# Patient Record
Sex: Male | Born: 1995 | Race: White | Hispanic: No | Marital: Married | Smoking: Never smoker
Health system: Southern US, Community
[De-identification: ages and names within clinical notes are randomized; demographics above are authoritative.]

## PROBLEM LIST (undated history)

## (undated) HISTORY — PX: EYE SURGERY: SHX253

---

## 2019-09-12 ENCOUNTER — Ambulatory Visit (INDEPENDENT_AMBULATORY_CARE_PROVIDER_SITE_OTHER)

## 2019-09-12 ENCOUNTER — Encounter: Payer: Self-pay | Admitting: Emergency Medicine

## 2019-09-12 ENCOUNTER — Ambulatory Visit
Admission: EM | Admit: 2019-09-12 | Discharge: 2019-09-12 | Disposition: A | Discharge status: Home / Self Care | Attending: Family Medicine | Admitting: Family Medicine

## 2019-09-12 ENCOUNTER — Other Ambulatory Visit: Payer: Self-pay

## 2019-09-12 ENCOUNTER — Ambulatory Visit: Attending: Family Medicine

## 2019-09-12 DIAGNOSIS — S32009A Unspecified fracture of unspecified lumbar vertebra, initial encounter for closed fracture: Secondary | ICD-10-CM

## 2019-09-12 DIAGNOSIS — W19XXXA Unspecified fall, initial encounter: Secondary | ICD-10-CM

## 2019-09-12 DIAGNOSIS — R52 Pain, unspecified: Secondary | ICD-10-CM

## 2019-09-12 DIAGNOSIS — W109XXA Fall (on) (from) unspecified stairs and steps, initial encounter: Secondary | ICD-10-CM

## 2019-09-12 MED ORDER — HYDROCODONE-ACETAMINOPHEN 5-325 MG PO TABS: ORAL_TABLET | ORAL | 0 refills | Status: AC

## 2019-09-12 MED ORDER — CYCLOBENZAPRINE HCL 10 MG PO TABS: 10.0000 mg | ORAL_TABLET | Freq: Every day | ORAL | 0 refills | Status: AC
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
## 2019-09-12 NOTE — Discharge Instructions (Addendum)
Further recommendations pending CT scan results Follow up with Orthopedist spine/back specialist
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
## 2019-09-12 NOTE — ED Notes (Addendum)
Tried to do Prior auth for CT scan. Pt did not have insurance card and was scanned into the chart. Instructed pt to bring insurance card back to the office.

## 2021-05-06 IMAGING — CR DG LUMBAR SPINE COMPLETE 4+V
5 series · 5 of 5 positions shown · non-contrast
Comparison: None.

CLINICAL DATA: Fall, pain

EXAM:
LUMBAR SPINE - COMPLETE 4+ VIEW

[l-spine ap]
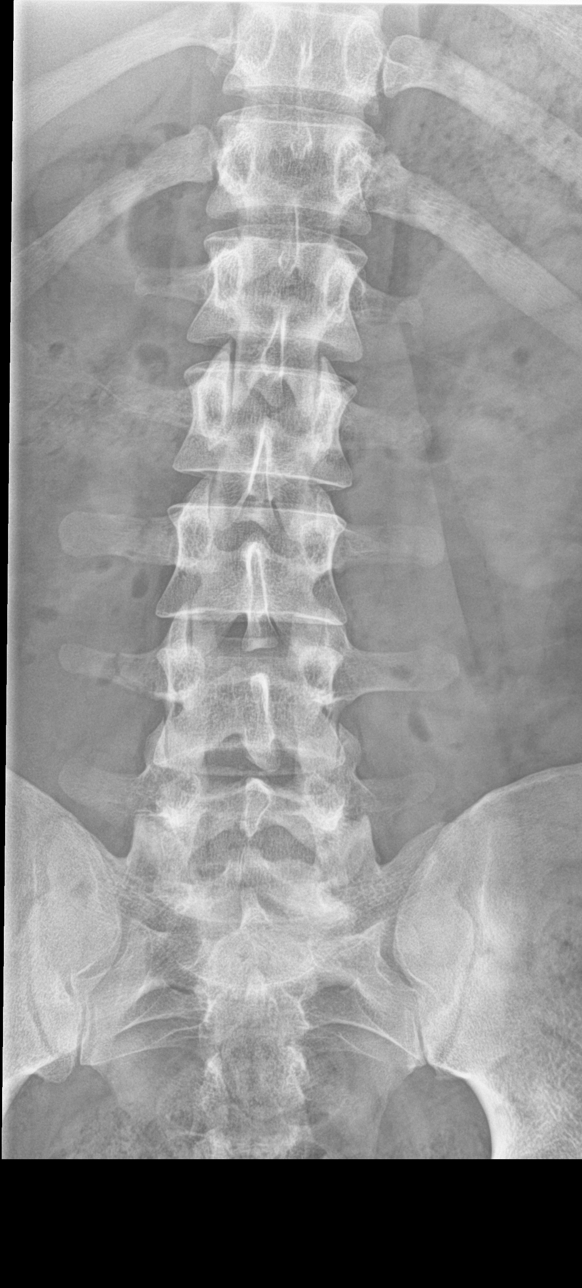

[l-spine obl (1 of 2)]
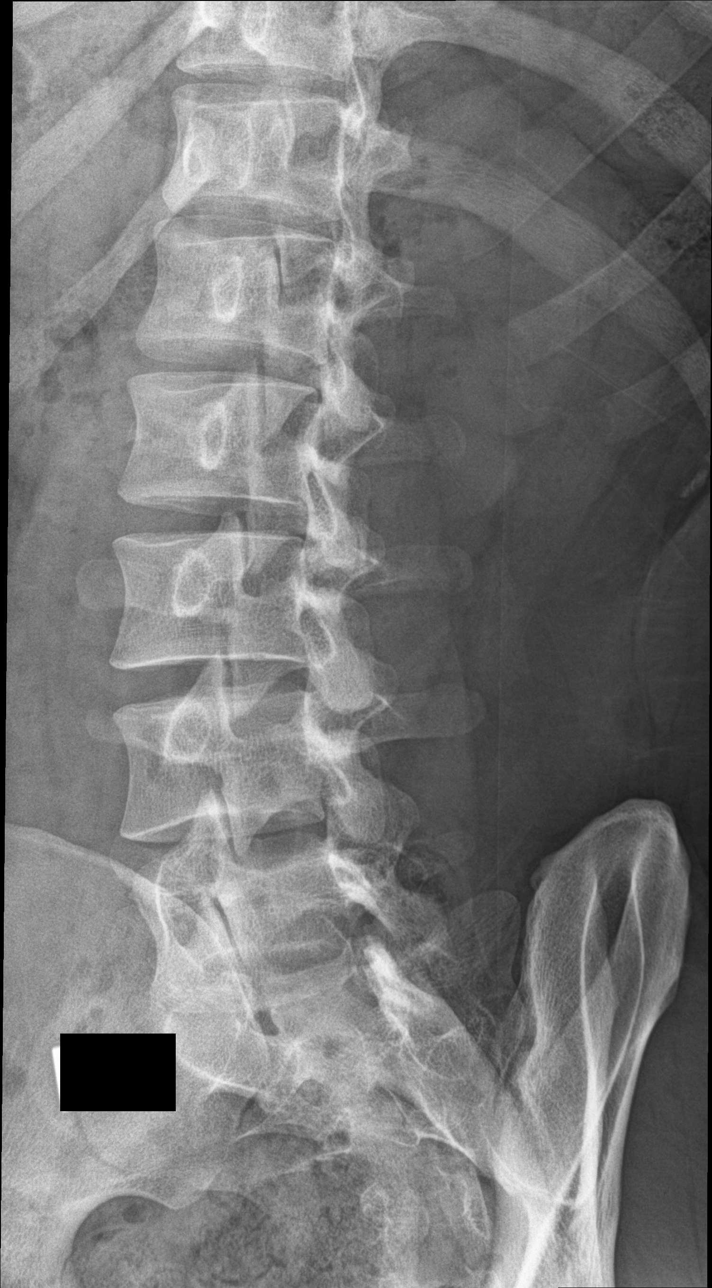

[l-spine obl (2 of 2)]
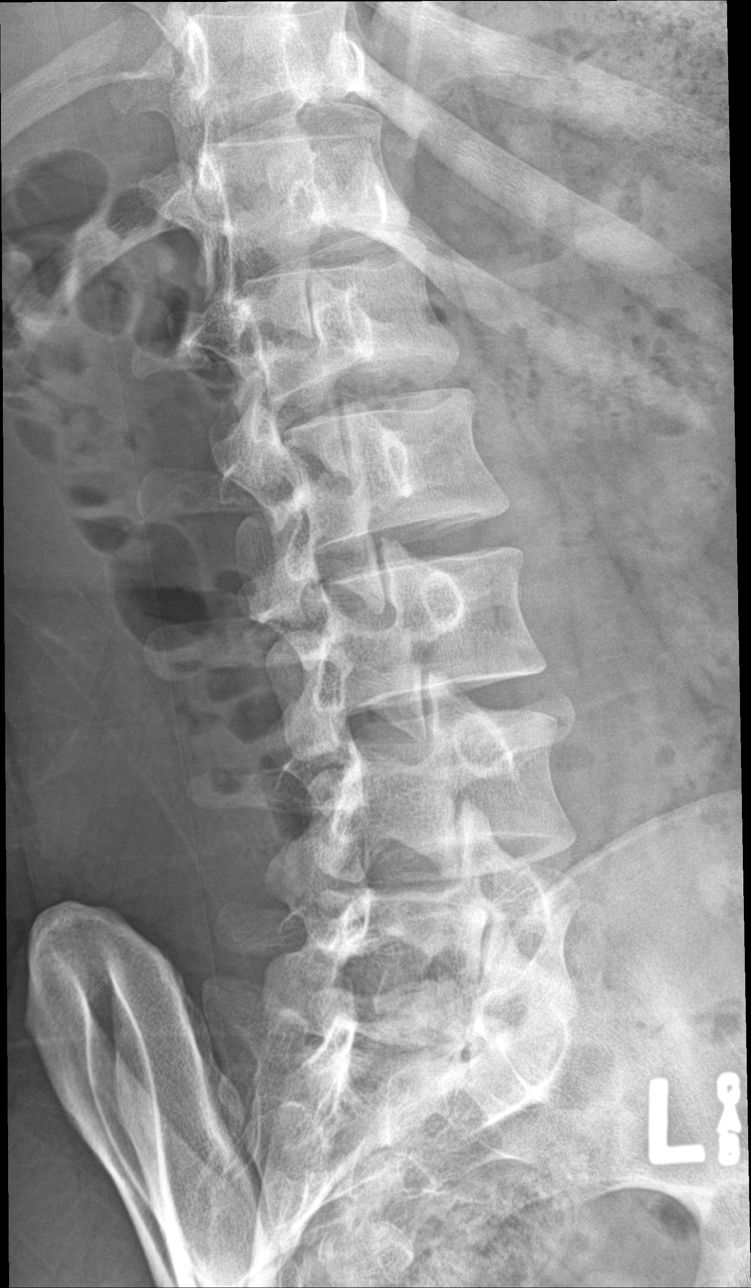

[l-spine lat]
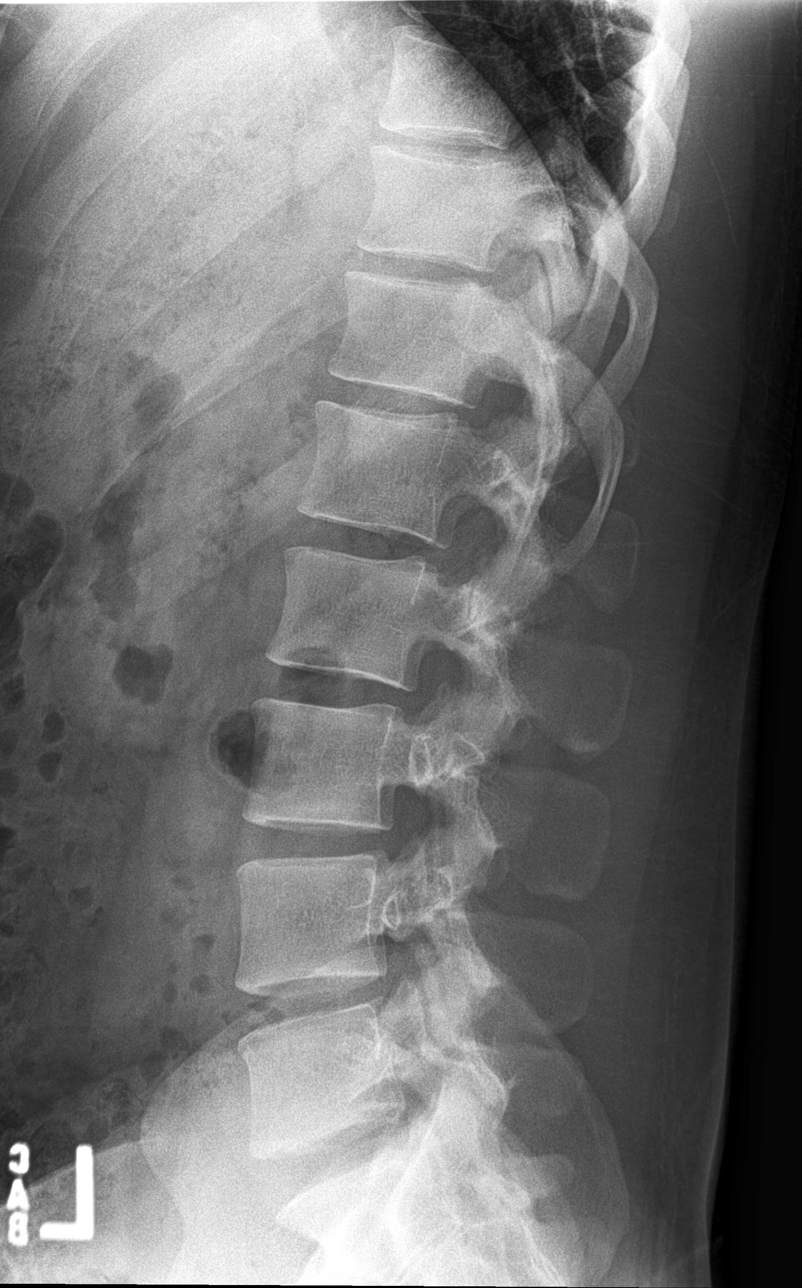

[l-spine spot]
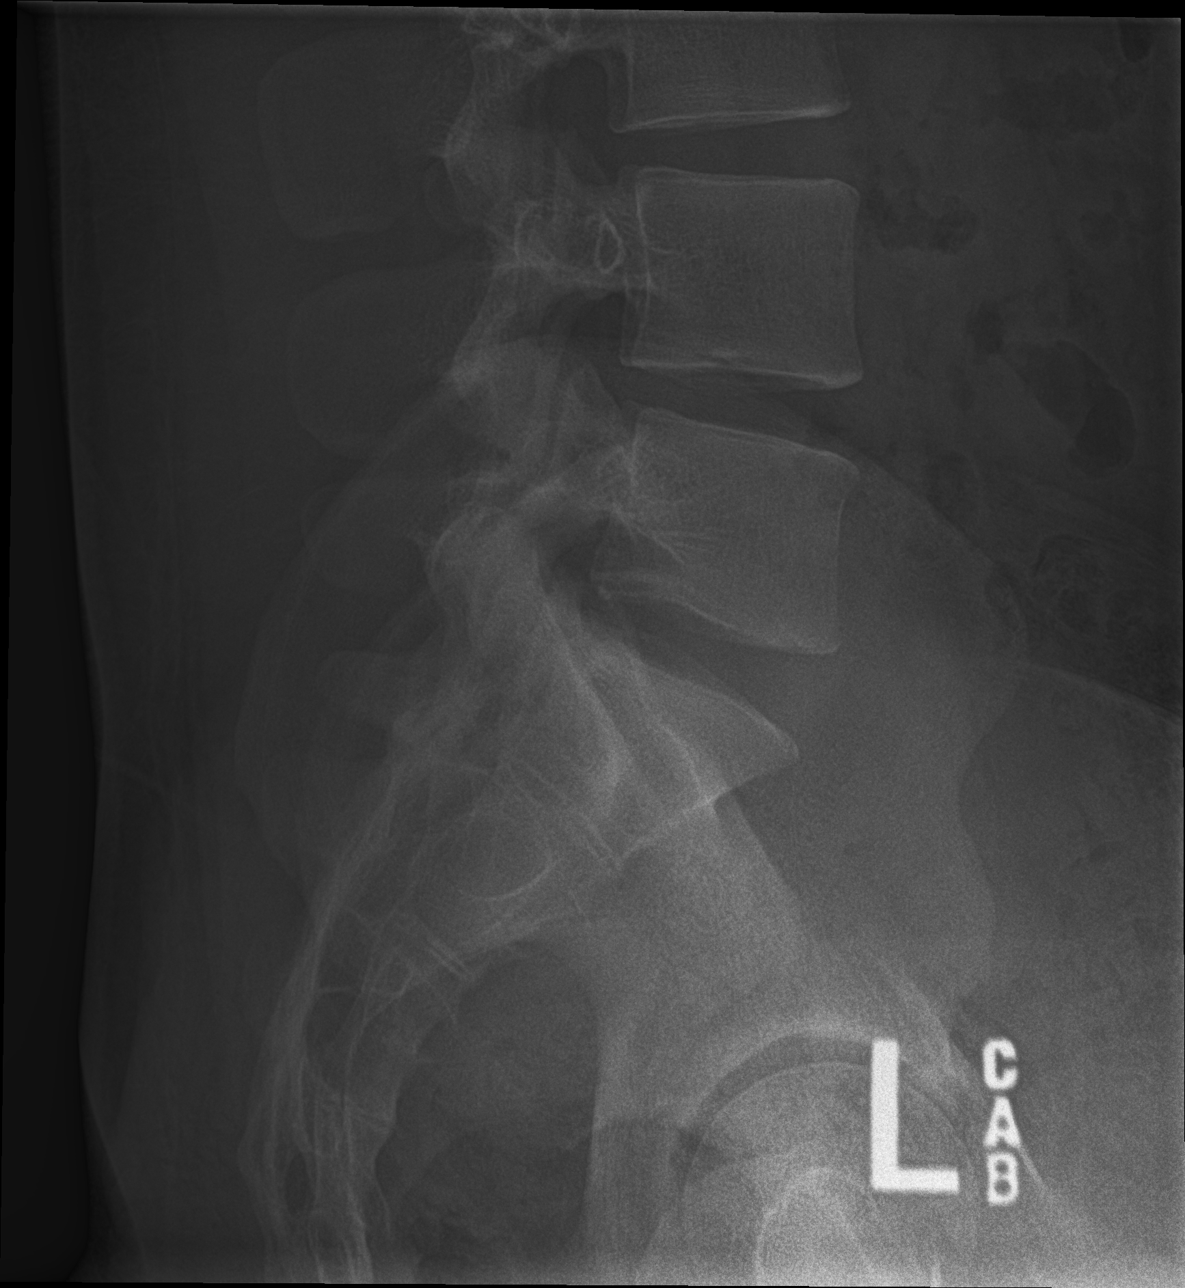

[5 of 5 positions shown; findings below may reference images not displayed]

FINDINGS: There are minimally displaced fractures of the right L3 and L4
transverse processes. No other fracture or dislocation of the lumbar
spine. Disc spaces and vertebral body heights are preserved.
Nonobstructive pattern of overlying bowel gas.
IMPRESSION: 1. There are minimally displaced fractures of the right L3 and L4
transverse processes.

2. No other fracture or dislocation of the lumbar spine. Disc spaces
and vertebral body heights are preserved.

## 2023-11-05 ENCOUNTER — Encounter: Payer: Self-pay | Admitting: Emergency Medicine

## 2023-11-05 ENCOUNTER — Ambulatory Visit
Admission: EM | Admit: 2023-11-05 | Discharge: 2023-11-05 | Disposition: A | Discharge status: Home / Self Care | Payer: Medicaid Other

## 2023-11-05 DIAGNOSIS — S86111A Strain of other muscle(s) and tendon(s) of posterior muscle group at lower leg level, right leg, initial encounter: Secondary | ICD-10-CM | POA: Diagnosis not present

## 2023-11-05 NOTE — Discharge Instructions (Addendum)
Rest your right calf is much as possible and avoid playing basketball for the time being.  You may do your normal activities of daily living.  Take over-the-counter ibuprofen or Aleve according to the package instructions to help with pain and inflammation.  Take these with food.  You may apply ice to your calf for 20 minutes at a time, 2-3 times a day, to help decrease inflammation and aid in pain relief.  Make sure there is a cloth between the ice and your skin so as to not cause skin damage.  Purchase a compression sleeve, such as a copper fit sleeve, from [employer] or the pharmacy to provide compression and add support.  Follow the rehabilitation exercises given your discharge instructions.  If your symptoms not better in 7 to 10 days, or they worsen, I recommend that you follow-up with orthopedics such as [facility] here in [place] or [place].

## 2023-11-05 NOTE — ED Provider Notes (Signed)
[facility]    CSN: [id number] Arrival date & time: 11/05/23  1428      History   Chief Complaint Chief Complaint  Patient presents with   Knee Injury    HPI Tiofilo Mancini is a 27 y.o. male.   HPI  27 year old male with no significant past medical history presents for evaluation of pain to the right knee after playing basketball yesterday.  He reports that he went up for a lay up and when he came back down he felt a pop and a sharp burning pain that went from the back of his knee down to his ankle.  He sat out and rested for a little bit but then when he tried to stand, and when he tries to walk, he continues to feel the pain in the back and outside of his knee near his calf that radiates down to his foot.  History reviewed. No pertinent past medical history.  There are no problems to display for this patient.   Past Surgical History:  Procedure Laterality Date   EYE SURGERY         Home Medications    Prior to Admission medications   Medication Sig Start Date End Date Taking? Authorizing Provider  cyclobenzaprine (FLEXERIL) 10 MG tablet Take 1 tablet (10 mg total) by mouth at bedtime. 09/12/19   Payton Mccallum, MD  HYDROcodone-acetaminophen (NORCO/VICODIN) 5-325 MG tablet 1-2 tabs po q 8 hours prn 09/12/19   Payton Mccallum, MD    Family History Family History  Problem Relation Age of Onset   Diabetes Father     Social History Social History   Tobacco Use   Smoking status: Never   Smokeless tobacco: Never  Vaping Use   Vaping status: Every Day  Substance Use Topics   Alcohol use: Yes     Allergies   Patient has no known allergies.   Review of Systems Review of Systems  Constitutional:  Negative for fever.  Musculoskeletal:  Positive for arthralgias and myalgias. Negative for joint swelling.  Skin:  Negative for color change.     Physical Exam Triage Vital Signs ED Triage Vitals  Encounter Vitals Group     BP 11/05/23 1447  126/83     Systolic BP Percentile --      Diastolic BP Percentile --      Pulse Rate 11/05/23 1447 86     Resp 11/05/23 1447 16     Temp 11/05/23 1447 98.4 F (36.9 C)     Temp Source 11/05/23 1447 Oral     SpO2 11/05/23 1447 97 %     Weight --      Height --      Head Circumference --      Peak Flow --      Pain Score 11/05/23 1446 7     Pain Loc --      Pain Education --      Exclude from Growth Chart --    No data found.  Updated Vital Signs BP 126/83 (BP Location: Left Arm)   Pulse 86   Temp 98.4 F (36.9 C) (Oral)   Resp 16   SpO2 97%   Visual Acuity Right Eye Distance:   Left Eye Distance:   Bilateral Distance:    Right Eye Near:   Left Eye Near:    Bilateral Near:     Physical Exam Vitals and nursing note reviewed.  Constitutional:      Appearance: Normal appearance. He  is not ill-appearing.  HENT:     Head: Normocephalic and atraumatic.  Musculoskeletal:        General: Tenderness and signs of injury present. No swelling. Normal range of motion.  Skin:    General: Skin is warm and dry.     Capillary Refill: Capillary refill takes less than 2 seconds.     Findings: No bruising or erythema.  Neurological:     General: No focal deficit present.     Mental Status: He is alert and oriented to person, place, and time.      UC Treatments / Results  Labs (all labs ordered are listed, but only abnormal results are displayed) Labs Reviewed - No data to display  EKG   Radiology No results found.  Procedures Procedures (including critical care time)  Medications Ordered in UC Medications - No data to display  Initial Impression / Assessment and Plan / UC Course  I have reviewed the triage vital signs and the nursing notes.  Pertinent labs & imaging results that were available during my care of the patient were reviewed by me and considered in my medical decision making (see chart for details).   Patient is a pleasant, nontoxic-appearing 18 old  male presenting for evaluation of pain in the posterior lateral aspect of his right knee.  With the patient is having pain is actually in the origin of the gastrocnemius muscle.  He has no pain with palpation of the patella, patellar tendon, tibial tuberosity, medial or lateral joint line, popliteal space, quadriceps complex, or with varus and valgus stress application.  He has a negative anterior and posterior drawer test.  The patient's pain is point tender on the origin of the gastrocnemius on the lateral aspect.  There is no overlying ecchymosis or erythema.  I will treat the patient for a gastrocnemius strain with rest, ice, compression, elevation.  If his symptoms or not better in the next week to 10 days he should follow-up with orthopedics.   Final Clinical Impressions(s) / UC Diagnoses   Final diagnoses:  Gastrocnemius strain, right, initial encounter     Discharge Instructions      Rest your right calf is much as possible and avoid playing basketball for the time being.  You may do your normal activities of daily living.  Take over-the-counter ibuprofen or Aleve according to the package instructions to help with pain and inflammation.  Take these with food.  You may apply ice to your calf for 20 minutes at a time, 2-3 times a day, to help decrease inflammation and aid in pain relief.  Make sure there is a cloth between the ice and your skin so as to not cause skin damage.  Purchase a compression sleeve, such as a copper fit sleeve, from [employer] or the pharmacy to provide compression and add support.  Follow the rehabilitation exercises given your discharge instructions.  If your symptoms not better in 7 to 10 days, or they worsen, I recommend that you follow-up with orthopedics such as [facility] here in [place] or [place].     ED Prescriptions   None    PDMP not reviewed this encounter.   Becky Augusta, NP 11/05/23 1510

## 2023-11-05 NOTE — ED Triage Notes (Signed)
Pt injured his right knee playing basketball yesterday. He feels the pain mainly in the back of his knee.
